# Patient Record
Sex: Male | Born: 1970 | Race: White | Hispanic: No | Marital: Married | Smoking: Never smoker
Health system: Southern US, Community
[De-identification: ages and names within clinical notes are randomized; demographics above are authoritative.]

## PROBLEM LIST (undated history)

## (undated) DIAGNOSIS — R6882 Decreased libido: Secondary | ICD-10-CM

## (undated) DIAGNOSIS — N50819 Testicular pain, unspecified: Secondary | ICD-10-CM

## (undated) DIAGNOSIS — R5383 Other fatigue: Secondary | ICD-10-CM

## (undated) DIAGNOSIS — K219 Gastro-esophageal reflux disease without esophagitis: Secondary | ICD-10-CM

## (undated) DIAGNOSIS — E291 Testicular hypofunction: Secondary | ICD-10-CM

## (undated) DIAGNOSIS — M1990 Unspecified osteoarthritis, unspecified site: Secondary | ICD-10-CM

## (undated) HISTORY — DX: Gastro-esophageal reflux disease without esophagitis: K21.9

## (undated) HISTORY — DX: Testicular pain, unspecified: N50.819

## (undated) HISTORY — DX: Other fatigue: R53.83

## (undated) HISTORY — PX: NO PAST SURGERIES: SHX2092

## (undated) HISTORY — DX: Testicular hypofunction: E29.1

## (undated) HISTORY — DX: Unspecified osteoarthritis, unspecified site: M19.90

## (undated) HISTORY — DX: Decreased libido: R68.82

---

## 1999-10-20 ENCOUNTER — Ambulatory Visit (HOSPITAL_COMMUNITY): Admission: RE | Admit: 1999-10-20 | Discharge: 1999-10-20 | Payer: Self-pay | Admitting: Neurosurgery

## 1999-10-20 ENCOUNTER — Encounter: Payer: Self-pay | Admitting: Neurosurgery

## 2001-11-24 ENCOUNTER — Encounter: Payer: Self-pay | Admitting: Gastroenterology

## 2001-11-24 ENCOUNTER — Encounter: Admission: RE | Admit: 2001-11-24 | Discharge: 2001-11-24 | Payer: Self-pay | Admitting: Gastroenterology

## 2002-10-12 ENCOUNTER — Encounter: Payer: Self-pay | Admitting: Internal Medicine

## 2003-05-26 ENCOUNTER — Ambulatory Visit (HOSPITAL_COMMUNITY): Admission: RE | Admit: 2003-05-26 | Discharge: 2003-05-26 | Payer: Self-pay | Admitting: Family Medicine

## 2004-12-09 IMAGING — US US SCROTUM
1 series · 14 of 25 positions shown · non-contrast
Comparison: none

CLINICAL DATA: Scrotal pain.

[Series 1: unknown · 0.09mm/px · 14 of 53 slices shown]
[im 1/53]
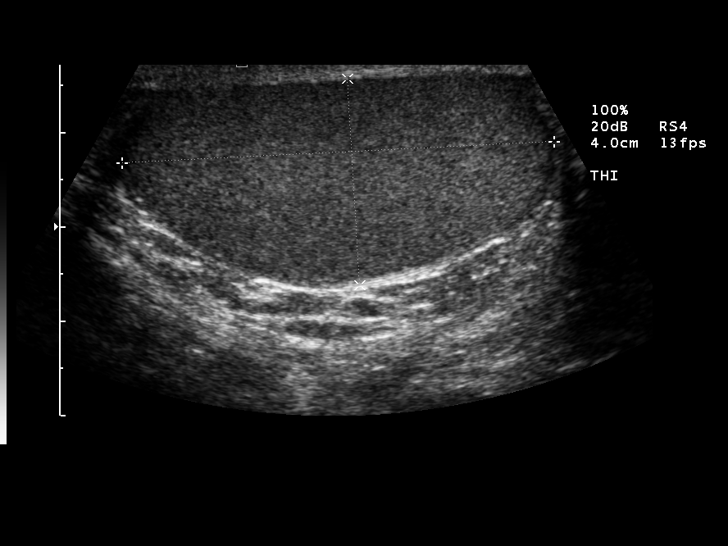
[im 5/53]
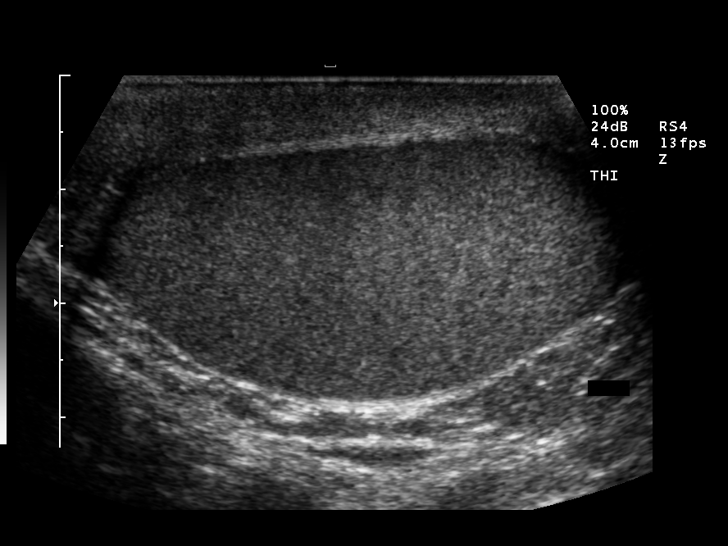
[im 9/53]
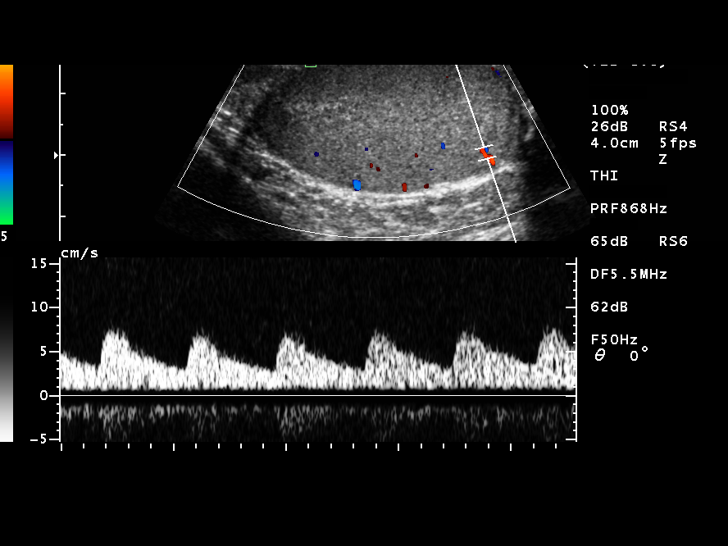
[im 14/53]
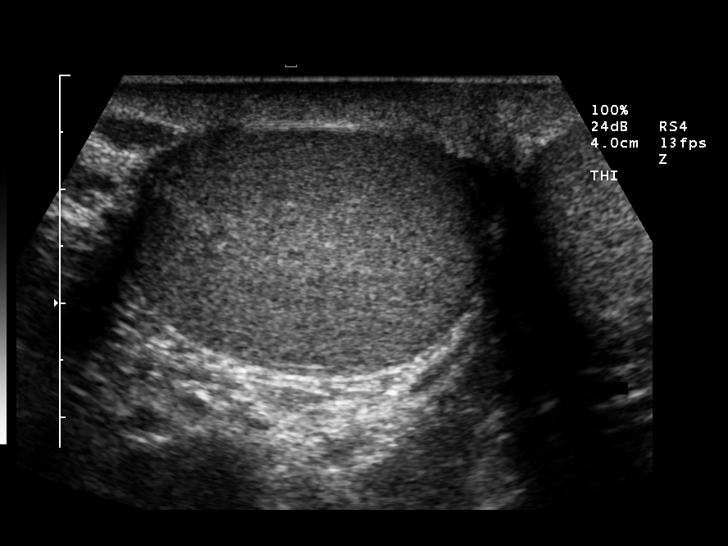
[im 18/53]
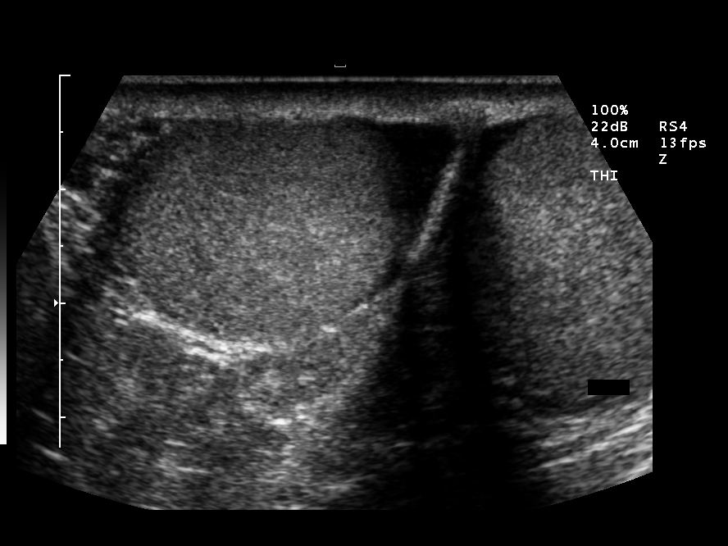
[im 20/53]
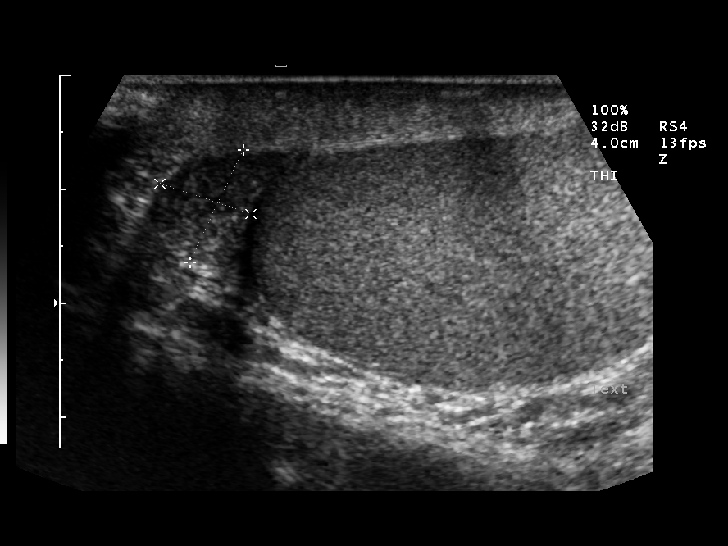
[im 24/53]
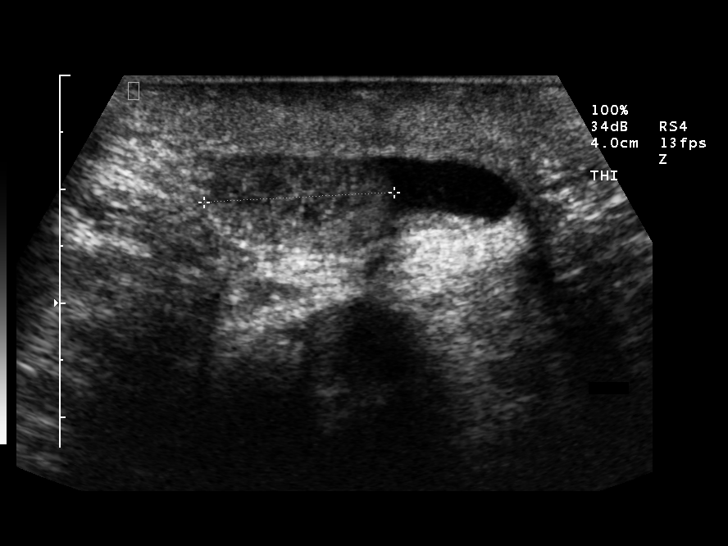
[im 29/53]
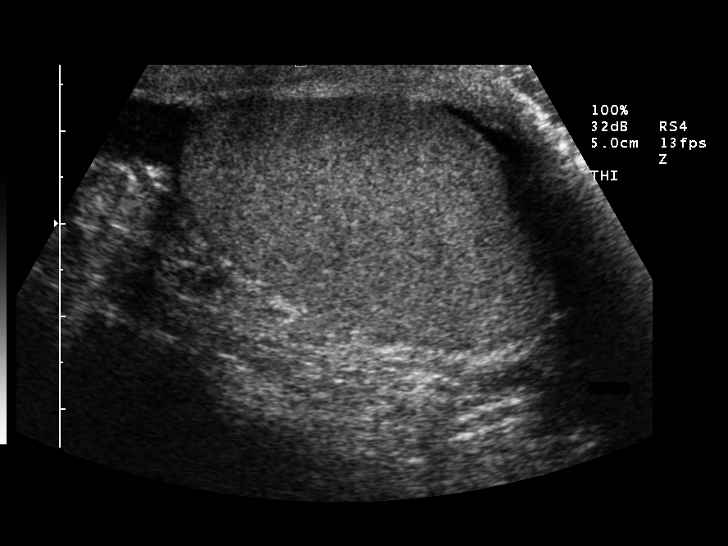
[im 33/53]
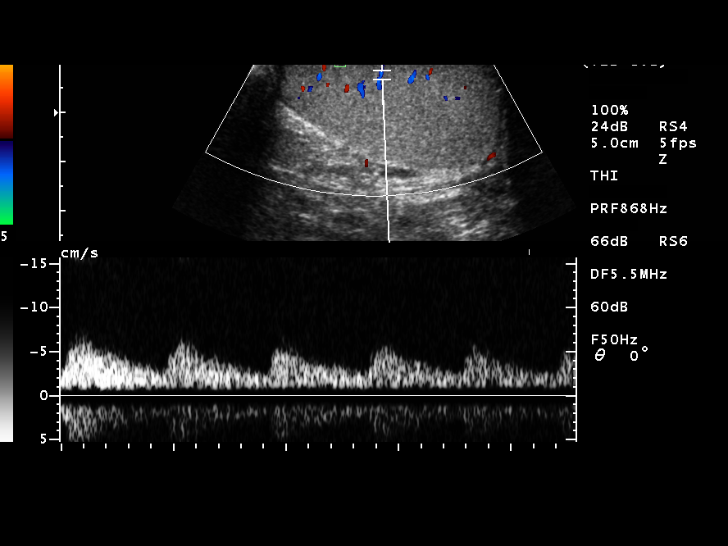
[im 35/53]
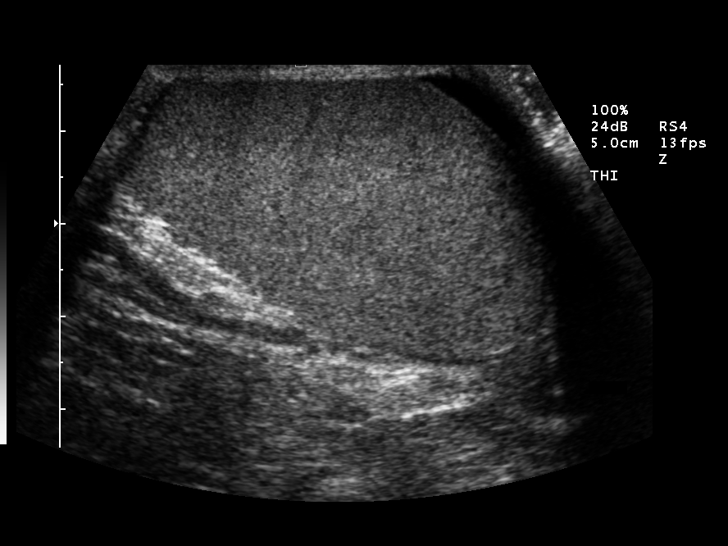
[im 40/53]
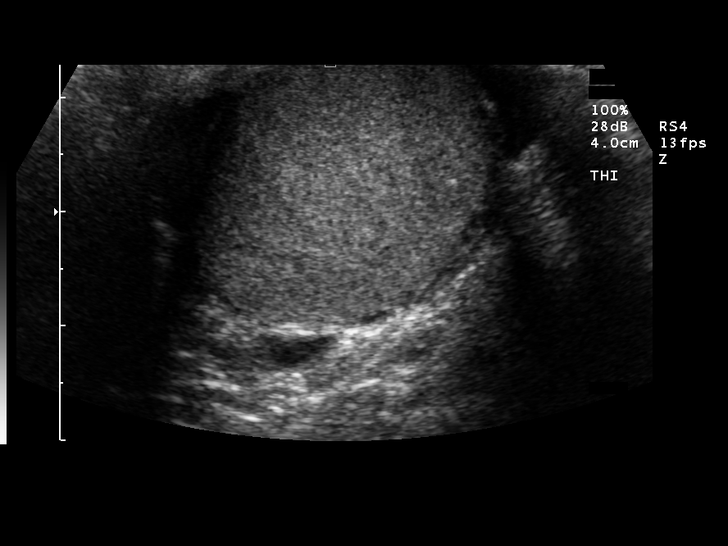
[im 44/53]
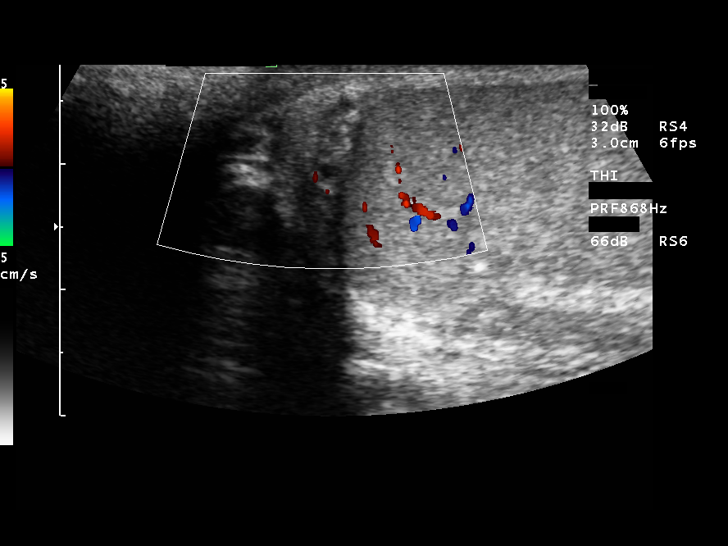
[im 48/53]
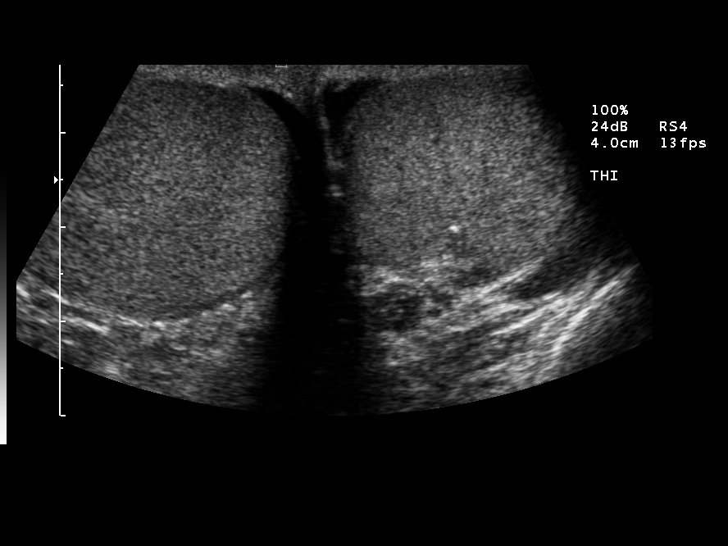
[im 53/53]
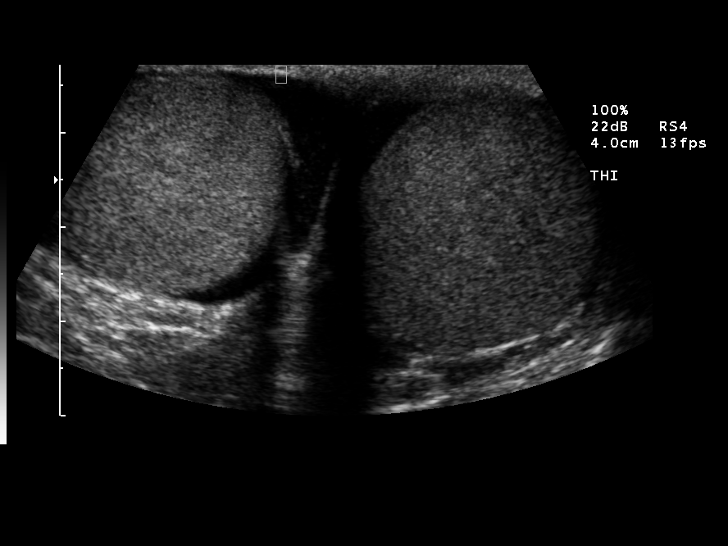

[14 of 25 positions shown; findings below may reference images not displayed]

ULTRASOUND OF THE SCROTUM
 The right testicle measures 4.6 x 2.2 x 3.1 cm.  The left testicle measures 4.9 x 2.7 x 3.0 cm.  The right and left epididymis is normal in size.  There is a small hydrocele bilaterally.  There is normal flow to both the right and left testicle.

 IMPRESSION
 No significant abnormality.  Small bilateral hydroceles.  The right and left epididymis is symmetric in size.

## 2007-07-24 ENCOUNTER — Emergency Department (HOSPITAL_COMMUNITY): Admission: EM | Admit: 2007-07-24 | Discharge: 2007-07-24 | Payer: Self-pay | Admitting: Emergency Medicine

## 2010-04-12 ENCOUNTER — Encounter: Payer: Self-pay | Admitting: Internal Medicine

## 2010-04-30 ENCOUNTER — Encounter: Payer: Self-pay | Admitting: Internal Medicine

## 2010-05-21 ENCOUNTER — Ambulatory Visit: Payer: Self-pay | Admitting: Internal Medicine

## 2010-05-21 DIAGNOSIS — R0609 Other forms of dyspnea: Secondary | ICD-10-CM

## 2010-05-21 DIAGNOSIS — J309 Allergic rhinitis, unspecified: Secondary | ICD-10-CM | POA: Insufficient documentation

## 2010-05-21 DIAGNOSIS — K219 Gastro-esophageal reflux disease without esophagitis: Secondary | ICD-10-CM

## 2010-05-21 DIAGNOSIS — R0989 Other specified symptoms and signs involving the circulatory and respiratory systems: Secondary | ICD-10-CM

## 2010-05-21 DIAGNOSIS — R0789 Other chest pain: Secondary | ICD-10-CM | POA: Insufficient documentation

## 2010-05-29 ENCOUNTER — Encounter: Payer: Self-pay | Admitting: Internal Medicine

## 2010-05-29 ENCOUNTER — Ambulatory Visit: Payer: Self-pay | Admitting: Internal Medicine

## 2010-06-03 ENCOUNTER — Telehealth: Payer: Self-pay | Admitting: Internal Medicine

## 2010-06-20 ENCOUNTER — Ambulatory Visit (HOSPITAL_COMMUNITY)
Admission: RE | Admit: 2010-06-20 | Discharge: 2010-06-20 | Payer: Self-pay | Source: Home / Self Care | Attending: Internal Medicine | Admitting: Internal Medicine

## 2010-06-20 ENCOUNTER — Encounter: Payer: Self-pay | Admitting: Internal Medicine

## 2010-07-14 ENCOUNTER — Encounter: Payer: Self-pay | Admitting: Family Medicine

## 2010-07-16 ENCOUNTER — Telehealth: Payer: Self-pay | Admitting: Internal Medicine

## 2010-07-25 NOTE — Letter (Signed)
[facility]   Imported By: Lennie Odor 06/05/2010 14:35:38  _____________________________________________________________________  External Attachment:    Type:   Image     Comment:   External Document

## 2010-07-25 NOTE — Miscellaneous (Signed)
Summary: Orders Update pft charges  Clinical Lists Changes  Orders: Added new Service order of Carbon Monoxide diffusing w/capacity (94720) - Signed Added new Service order of Lung Volumes (94240) - Signed Added new Service order of Spirometry (Pre & Post) (94060) - Signed 

## 2010-07-25 NOTE — Assessment & Plan Note (Signed)
Summary: CHEST TIGHTNESS/ MBW   Visit Type:  Initial Consult Copy to:  Dr. Loreta Ave Primary Provider/Referring Provider:  Dr. Charna Elizabeth  CC:  Pulmonary Consult for chest tightness. Pt has had 2 sinus infections in last 2 months. .  History of Present Illness: May 21, 2010: 40 year old male with passive smoking from birth to age 53 years.  c/o chest tightness. States that in younger years (age [age] years) was a speed Financial controller and he recollects that at peak exertion at that time would have chest tightness and post exertion would have cough. This has persisted all along even to this day and would notice exertional chest tightness for activities like running. He noticed this even after he joined [employer] at age 56. After that very physically active till few years ago and still would feel dyspneic at peak exertion.  In retrosepct, he thinks he might have had "SPORTS ASTHMA"  Past 2-4 years, sedantary without problems. However, some 3-4 months ago had a sinus infection and since then noticing chest tightness even at rest. Comes and goes. No specific precipitating or aggravating factors. Location is center of chest. Deep breathing changes tightness to dry cough but only sometimes. No associated radiation. Severity is mild to moderate. Has noticed mild associated dyspnea with chest tightnss but no wheeze. No sputum with cough but occassionally will have some mucus.  Then again 3 weeks ago had 2nd sinus infection which he thinks has made chest tightness worse.   Of note (in review of DR. Mann records), he states he has had GERD with heart burning "for a while". Then, in end OCt 2011 felt sudden food impaction. Had  UGI scope 04/15/2010 and biopsies showed eosinophlic esophagitis. So, started on flovent mdi but asked to swallow and PPI aciphex. With this, GERD symptoms are resolved. However, above chest tightness remains   Preventive Screening-Counseling & Management  Alcohol-Tobacco     Smoking  Status: never     Passive Smoke Exposure: yes  Comments: passive smoke - birth to age 14  Current Medications (verified): 1)  Aciphex 20 Mg Tbec (Rabeprazole Sodium) .... Take 1 Tablet By Mouth Once A Day 2)  Multivitamins  Tabs (Multiple Vitamin) .... Take 1 Tablet By Mouth Once A Day 3)  Flax Seed Oil 1000 Mg Caps (Flaxseed (Linseed)) .... Take 1 Tablet By Mouth Once A Day 4)  Flovent Hfa 110 Mcg/act Aero (Fluticasone Propionate  Hfa) .... 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 Smoking Status:  never Passive Smoke Exposure:  yes  Review of Systems       The patient complains of shortness of breath with activity, shortness of breath at rest, productive cough, non-productive cough, acid heartburn, indigestion, difficulty swallowing, sore throat, headaches, nasal congestion/difficulty breathing through nose, sneezing, and change in color of mucus.  The patient denies coughing up blood, chest pain, irregular heartbeats, loss of appetite, weight change, abdominal pain, tooth/dental problems, itching, ear ache, anxiety, depression, hand/feet swelling, joint stiffness or pain, rash, and fever.         chest tightness snores +  Vital Signs:  Patient profile:  40 year old male Height:      72 inches Weight:      208.38 pounds BMI:     28.36 O2 Sat:      97 % on Room air Temp:     97.8 degrees F oral Pulse rate:   88 / minute BP sitting:   146 / 82  (left arm) Cuff size:   regular  Vitals Entered By: Carron Curie CMA (May 21, 2010 9:06 AM)  O2 Flow:  Room air  Physical  Exam  General:  well developed, well nourished, in no acute distress Head:  normocephalic and atraumatic Eyes:  PERRLA/EOM intact; conjunctiva and sclera clear Ears:  TMs intact and clear with normal canals Nose:  no deformity, discharge, inflammation, or lesions Mouth:  no deformity or lesions Neck:  no masses, thyromegaly, or abnormal cervical nodes Chest Wall:  no deformities noted Lungs:  clear bilaterally to auscultation and percussion Heart:  regular rate and rhythm, S1, S2 without murmurs, rubs, gallops, or clicks Abdomen:  bowel sounds positive; abdomen soft and non-tender without masses, or organomegaly Msk:  no deformity or scoliosis noted with normal posture Pulses:  pulses normal Extremities:  no clubbing, cyanosis, edema, or deformity noted Neurologic:  CN II-XII grossly intact with normal reflexes, coordination, muscle strength and tone Skin:  intact without lesions or rashes Cervical Nodes:  no significant adenopathy Axillary Nodes:  no significant adenopathy Psych:  alert and cooperative; normal mood and affect; normal attention span and concentration   Impression & Recommendations:  Problem # 1:  CHEST DISCOMFORT (ICD-786.59) Assessment New Story of chest tightness with cough in the past when he  Exercises. And story that this is now occurring at rest following recent sinus infection,  in context of passive smoking, family history of asthma, persona hx of allergic rhinits are all very suggestive he has  asthma currently to account for symptoms  PLAN Flu shot today GEt full PFTs IF these are normal, will get methacholine challenge test Will get allergy records from [facility] For review  Orders: Pulmonary Referral (Pulmonary) Consultation Level V (30865)  Problem # 2:  SNORING (ICD-786.09) Assessment: New  do epworth sleep questionnaire next visit His updated medication list for this problem includes:    Flovent Hfa 110 Mcg/act Aero (Fluticasone  propionate  hfa) .Marland Kitchen... 2 puffs once a day  Orders: Consultation Level V (78469)  Medications Added to Medication List This Visit: 1)  Aciphex 20 Mg Tbec (Rabeprazole sodium) .... Take 1 tablet by mouth once a day 2)  Multivitamins Tabs (Multiple vitamin) .... Take 1 tablet by mouth once a day 3)  Flax Seed Oil 1000 Mg Caps (Flaxseed (linseed)) .... Take 1 tablet by mouth once a day 4)  Flovent Hfa 110 Mcg/act Aero (Fluticasone propionate  hfa) .... 2 puffs once a day  Patient Instructions: 1)  please have full PFTs 2)  have flu shot today 3)  after PFT test, let nurse know to inform me 4)  i will call with result and discuss next steep  Appended Document: CHEST TIGHTNESS/ MBW pls get allergy records from Dr. Stevphen [place] office  Appended Document: flu vaccine     Clinical Lists Changes  Orders: Added new Service order of Flu Vaccine 20yrs + [phone]) - Signed Added new Service order of Admin 1st Vaccine (84132) - Signed Observations: Added new observation of FLU VAXLOT: [id number] (05/21/2010 11:56) Added new observation of FLU VAX EXP: 12/21/2010 (05/21/2010 11:56) Added new observation  of FLU VAXBY: Carron Curie CMA (05/21/2010 11:56) Added new observation of FLU VAXRTE: IM (05/21/2010 11:56) Added new observation of FLU VAX DSE: 0.5 ml (05/21/2010 11:56) Added new observation of FLU VAXMFR: GlaxoSmithKline (05/21/2010 11:56) Added new observation of FLU VAX SITE: left deltoid (05/21/2010 11:56) Added new observation of FLU VAX: Fluvax 3+ (05/21/2010 11:56)       Immunizations Administered:  Influenza Vaccine # 1:    Vaccine Type: Fluvax 3+    Site: left deltoid    Mfr: GlaxoSmithKline    Dose: 0.5 ml    Route: IM    Given by: Carron Curie CMA    Exp. Date: 12/21/2010    Lot #: [id number]  Flu Vaccine Consent Questions:    Do you have a history of severe allergic reactions to this vaccine? no    Any prior history of allergic reactions to egg  and/or gelatin? no    Do you have a sensitivity to the preservative Thimersol? no    Do you have a past history of Guillan-Barre Syndrome? no    Do you currently have an acute febrile illness? no    Have you ever had a severe reaction to latex? no    Vaccine information given and explained to patient? yes  Appended Document: CHEST TIGHTNESS/ MBW records requested.

## 2010-07-25 NOTE — Progress Notes (Signed)
Summary: needs methacholinechallenge-[id number]  Phone Note Outgoing Call   Summary of Call: please tell him PFT 05/29/2010 is normal. So, needs methacholine challenge test. Should be off all inhalers  . ROV after methachokine challenge Initial call taken by: Kalman Shan MD,  June 03, 2010 5:28 PM  Follow-up for Phone Call        [email or web address]. Carron Curie CMA  June 04, 2010 9:06 AM   Additional Follow-up for Phone Call Additional follow up Details #1::        spoke to pt methacholine challenge scheduled for 06/06/10@11 :00am pt aware and will set up appt afterwards Additional Follow-up by: Oneita Jolly,  June 04, 2010 9:44 AM

## 2010-07-25 NOTE — Letter (Signed)
Summary: [facility]  [facility]   Imported By: Sherian Rein 05/24/2010 10:02:34  _____________________________________________________________________  External Attachment:    Type:   Image     Comment:   External Document

## 2010-07-25 NOTE — Progress Notes (Signed)
Summary: methachiline challenge 12/29 is normal  Phone Note Outgoing Call   Summary of Call: methacholine challenge 06/20/2010 is normal.  Pls give him appt to discuss Initial call taken by: Kalman Shan MD,  July 16, 2010 2:49 AM  Follow-up for Phone Call        pt advised of test results and th ened to set appt. he states he has to look over calender and call us back fo rappt. Carron Curie CMA  July 16, 2010 4:14 PM

## 2010-07-25 NOTE — Procedures (Signed)
Summary: EGD/[facility]  EGD/[facility]   Imported By: Sherian Rein 05/24/2010 10:00:17  _____________________________________________________________________  External Attachment:    Type:   Image     Comment:   External Document

## 2012-06-07 ENCOUNTER — Encounter: Payer: Self-pay | Admitting: *Deleted

## 2012-06-08 ENCOUNTER — Ambulatory Visit (INDEPENDENT_AMBULATORY_CARE_PROVIDER_SITE_OTHER): Payer: BC Managed Care – PPO | Admitting: Cardiovascular Disease

## 2012-06-08 VITALS — BP 142/89 | HR 89 | Ht 72.0 in | Wt 212.0 lb

## 2012-06-08 DIAGNOSIS — K219 Gastro-esophageal reflux disease without esophagitis: Secondary | ICD-10-CM

## 2012-06-08 DIAGNOSIS — E291 Testicular hypofunction: Secondary | ICD-10-CM

## 2012-06-08 DIAGNOSIS — R0789 Other chest pain: Secondary | ICD-10-CM

## 2012-06-08 DIAGNOSIS — R7989 Other specified abnormal findings of blood chemistry: Secondary | ICD-10-CM | POA: Insufficient documentation

## 2012-06-08 NOTE — Assessment & Plan Note (Signed)
Continue H2 blocker avoid high carb diet

## 2012-06-08 NOTE — Progress Notes (Signed)
Patient ID: Tony Warren, male   DOB: 1970-06-30, 41 y.o.   MRN: [id number] 41 yo referred by Dr Wilson Singer for atypical chest pain.  Going on last 3 weeks.  Atypical some pleuritic component.  Exercises a lot at [facility] and runs 6 miles/week with no problem  Sees Dr Annabell Howells for testosterone replacement.  Admits to previous use of steroids.  Pain improving last week.  Also has soreness in chest and back Relates this to bench pressing.  No other risk factors for CAD No palpitations, dyspnea or edema.  Denies other drug use or ETOH  ROS: Denies fever, malais, weight loss, blurry vision, decreased visual acuity, cough, sputum, SOB, hemoptysis, pleuritic pain, palpitaitons, heartburn, abdominal pain, melena, lower extremity edema, claudication, or rash.  All other systems reviewed and negative   General: Affect appropriate Healthy:  appears stated age HEENT: normal Neck supple with no adenopathy JVP normal no bruits no thyromegaly Lungs clear with no wheezing and good diaphragmatic motion Heart:  S1/S2 no murmur,rub, gallop or click PMI normal Abdomen: benighn, BS positve, no tenderness, no AAA no bruit.  No HSM or HJR Distal pulses intact with no bruits No edema Neuro non-focal Skin warm and dry No muscular weakness  Medications Current Outpatient Prescriptions  Medication Sig Dispense Refill  . Flaxseed, Linseed, (FLAXSEED OIL) 1000 MG CAPS Take 1 capsule by mouth daily.      . Glucosamine-Chondroitin (GLUCOSAMINE CHONDR COMPLEX PO) Take 1 capsule by mouth daily.      . Multiple Vitamins-Minerals (MULTIVITAMINS THER. W/MINERALS) TABS Take 1 tablet by mouth daily.      . Omega-3 Fatty Acids (FISH OIL) 1000 MG CAPS Take 1 capsule by mouth daily.      . RABEprazole (ACIPHEX) 20 MG tablet Take 20 mg by mouth daily.      Marland Kitchen testosterone cypionate (DEPOTESTOTERONE CYPIONATE) 200 MG/ML injection Inject into the muscle every 14 (fourteen) days.        Allergies Review of patient's allergies indicates no  known allergies.  Family History: Family History  Problem Relation Age of Onset  . Hypercholesterolemia      paternal   . Hypertension      maternal  . Non-Hodgkin's lymphoma      paternal    Social History: History   Social History  . Marital Status: Married    Spouse Name: N/A    Number of Children: N/A  . Years of Education: N/A   Occupational History  . Not on file.   Social History Main Topics  . Smoking status: Never Smoker   . Smokeless tobacco: Never Used  . Alcohol Use: No  . Drug Use: No  . Sexually Active: Not on file   Other Topics Concern  . Not on file   Social History Narrative  . No narrative on file    Electrocardiogram:  NSR rate 71 normal ECG no pericardial changes  Assessment and Plan

## 2012-06-08 NOTE — Assessment & Plan Note (Signed)
F/U Dr Annabell Howells Continue replacement.  Not clear that some of his myalgias and pain may not be from medium for IM injections

## 2012-06-08 NOTE — Assessment & Plan Note (Signed)
Atypical and improving Encouraged him to take advil or NSAI for 2 weeks or so to help.  No need for stress test at this time as exam and ECG normal no risk factors and atypical pain

## 2012-06-08 NOTE — Patient Instructions (Signed)
Your physician recommends that you schedule a follow-up appointment in: AS NEEDED  MAY CALL BACK AT A LATER DATE IF WOULD LIKE TO PROCEED WITH STRESS TEST IN FUTURE

## 2018-03-26 ENCOUNTER — Ambulatory Visit (INDEPENDENT_AMBULATORY_CARE_PROVIDER_SITE_OTHER): Payer: Self-pay | Admitting: Podiatry

## 2018-03-26 ENCOUNTER — Encounter: Payer: Self-pay | Admitting: Podiatry

## 2018-03-26 ENCOUNTER — Ambulatory Visit: Payer: Self-pay

## 2018-03-26 DIAGNOSIS — M722 Plantar fascial fibromatosis: Secondary | ICD-10-CM

## 2018-03-26 NOTE — Patient Instructions (Addendum)

## 2018-03-28 NOTE — Progress Notes (Signed)
   Subjective: 47 year old male presenting today as a new patient with a chief complaint of intermittent aching pain to the plantar aspect of the left heel that began about one year ago. He states the pain is worse in the morning when he first gets out of bed. Walking and being active on his feet increases the pain. He has tried stretching and icing the area for treatment. Patient is here for further evaluation and treatment.   Past Medical History:  Diagnosis Date  . Arthritis   . Decreased libido   . Esophageal reflux   . Fatigue   . Hypogonadism, male   . Testicular pain      Objective: Physical Exam General: The patient is alert and oriented x3 in no acute distress.  Dermatology: Skin is warm, dry and supple bilateral lower extremities. Negative for open lesions or macerations bilateral.   Vascular: Dorsalis Pedis and Posterior Tibial pulses palpable bilateral.  Capillary fill time is immediate to all digits.  Neurological: Epicritic and protective threshold intact bilateral.   Musculoskeletal: Tenderness to palpation to the plantar aspect of the left heel along the plantar fascia. All other joints range of motion within normal limits bilateral. Strength 5/5 in all groups bilateral.   Radiographic exam:   Normal osseous mineralization. Joint spaces preserved. No fracture/dislocation/boney destruction. No other soft tissue abnormalities or radiopaque foreign bodies.   Assessment: 1. Plantar fasciitis left foot  Plan of Care:  1. Patient evaluated. Xrays reviewed.   2. Injection of 0.5cc Celestone soluspan injected into the left plantar fascia.  3. Continue taking Meloxicam daily.  4. Discontinue using plantar fascial brace since it did not help.  5. Return to clinic as needed.    Felecia Shelling, DPM [facility]  Dr. Felecia Shelling, DPM    [address] [place], [place] [address]                Office [phone]    Fax [phone]

## 2020-05-14 ENCOUNTER — Other Ambulatory Visit: Payer: Self-pay | Admitting: Family

## 2020-05-14 ENCOUNTER — Telehealth (HOSPITAL_COMMUNITY): Payer: Self-pay

## 2020-05-14 DIAGNOSIS — U071 COVID-19: Secondary | ICD-10-CM

## 2020-05-14 NOTE — Telephone Encounter (Signed)
Called to discuss with patient about Covid symptoms and the use of the monoclonal antibody infusion for those with mild to moderate Covid symptoms and at a high risk of hospitalization.     Pt appears to qualify for this infusion due to co-morbid conditions and/or a member of an at-risk group in accordance with the FDA Emergency Use Authorization.    Risk factors include: Asthma   Symptom onset: 11/17. Cough, congestion, and fever   Tested positive for COVID 19: 11/22 at [facility].    Discussed information regarding costs of monoclonal antibody treatment, given both CPT & REV codes, and encouraged patient to call their health insurance company to verify cost of treatment that pt will be financially responsible for.  Patient aware they will receive a call from APP for further information and to schedule appointment. All questions answered.

## 2020-05-14 NOTE — Progress Notes (Signed)
I connected by phone with Tony Warren on 05/14/2020 at 6:21 PM to discuss the potential use of a new treatment for mild to moderate COVID-19 viral infection in non-hospitalized patients.  This patient is a 49 y.o. male that meets the FDA criteria for Emergency Use Authorization of COVID monoclonal antibody casirivimab/imdevimab, bamlanivimab/eteseviamb, or sotrovimab.  Has a (+) direct SARS-CoV-2 viral test result  Has mild or moderate COVID-19   Is NOT hospitalized due to COVID-19  Is within 10 days of symptom onset  Has at least one of the high risk factor(s) for progression to severe COVID-19 and/or hospitalization as defined in EUA.  Specific high risk criteria : Chronic Lung Disease   Symptom onset cough, sinus drainage began 05/09/20.   I have spoken and communicated the following to the patient or parent/caregiver regarding COVID monoclonal antibody treatment:  1. FDA has authorized the emergency use for the treatment of mild to moderate COVID-19 in adults and pediatric patients with positive results of direct SARS-CoV-2 viral testing who are 36 years of age and older weighing at least 40 kg, and who are at high risk for progressing to severe COVID-19 and/or hospitalization.  2. The significant known and potential risks and benefits of COVID monoclonal antibody, and the extent to which such potential risks and benefits are unknown.  3. Information on available alternative treatments and the risks and benefits of those alternatives, including clinical trials.  4. Patients treated with COVID monoclonal antibody should continue to self-isolate and use infection control measures (e.g., wear mask, isolate, social distance, avoid sharing personal items, clean and disinfect "high touch" surfaces, and frequent handwashing) according to CDC guidelines.   5. The patient or parent/caregiver has the option to accept or refuse COVID monoclonal antibody treatment.  After reviewing this  information with the patient, the patient has agreed to receive one of the available covid 19 monoclonal antibodies and will be provided an appropriate fact sheet prior to infusion. [name], NP 05/14/2020 6:21 PM

## 2020-05-15 ENCOUNTER — Ambulatory Visit (HOSPITAL_COMMUNITY)
Admission: RE | Admit: 2020-05-15 | Discharge: 2020-05-15 | Disposition: A | Discharge status: Home / Self Care | Payer: 59 | Source: Ambulatory Visit | Attending: Pulmonary Disease | Admitting: Pulmonary Disease

## 2020-05-15 ENCOUNTER — Other Ambulatory Visit (HOSPITAL_COMMUNITY): Payer: Self-pay

## 2020-05-15 DIAGNOSIS — J989 Respiratory disorder, unspecified: Secondary | ICD-10-CM | POA: Insufficient documentation

## 2020-05-15 DIAGNOSIS — U071 COVID-19: Secondary | ICD-10-CM | POA: Diagnosis present

## 2020-05-15 MED ORDER — ALBUTEROL SULFATE HFA 108 (90 BASE) MCG/ACT IN AERS: 2.0000 | INHALATION_SPRAY | Freq: Once | RESPIRATORY_TRACT | Status: DC | PRN

## 2020-05-15 MED ORDER — METHYLPREDNISOLONE SODIUM SUCC 125 MG IJ SOLR: 125.0000 mg | Freq: Once | INTRAMUSCULAR | Status: DC | PRN

## 2020-05-15 MED ORDER — FAMOTIDINE IN NACL 20-0.9 MG/50ML-% IV SOLN: 20.0000 mg | Freq: Once | INTRAVENOUS | Status: DC | PRN

## 2020-05-15 MED ORDER — SOTROVIMAB 500 MG/8ML IV SOLN
500.0000 mg | Freq: Once | INTRAVENOUS | Status: AC
  Administered 2020-05-15: 500 mg via INTRAVENOUS

## 2020-05-15 MED ORDER — EPINEPHRINE 0.3 MG/0.3ML IJ SOAJ: 0.3000 mg | Freq: Once | INTRAMUSCULAR | Status: DC | PRN

## 2020-05-15 MED ORDER — DIPHENHYDRAMINE HCL 50 MG/ML IJ SOLN: 50.0000 mg | Freq: Once | INTRAMUSCULAR | Status: DC | PRN

## 2020-05-15 MED ORDER — SODIUM CHLORIDE 0.9 % IV SOLN: INTRAVENOUS | Status: DC | PRN

## 2020-05-15 NOTE — Progress Notes (Signed)
Diagnosis: COVID-19  Physician: Dr. Patrick Wright  Procedure: Covid Infusion Clinic Med: Sotrovimab infusion - Provided patient with sotrovimab fact sheet for patients, parents, and caregivers prior to infusion.   Complications: No immediate complications noted  Discharge: Discharged home  If after the infusion you have any questions or concerns please call the Advanced Practice Provider at [phone] 

## 2020-05-15 NOTE — Discharge Instructions (Signed)

## 2020-05-15 NOTE — Progress Notes (Signed)
Patient reviewed Fact Sheet for Patients, Parents, and Caregivers for Emergency Use Authorization (EUA) of Sotrovimab for the Treatment of Coronavirus. Patient also reviewed and is agreeable to the estimated cost of treatment. Patient is agreeable to proceed.   

## 2022-05-08 DIAGNOSIS — Z13220 Encounter for screening for lipoid disorders: Secondary | ICD-10-CM | POA: Diagnosis not present

## 2022-05-08 DIAGNOSIS — Z0000 Encounter for general adult medical examination without abnormal findings: Secondary | ICD-10-CM | POA: Diagnosis not present

## 2022-05-08 DIAGNOSIS — Z125 Encounter for screening for malignant neoplasm of prostate: Secondary | ICD-10-CM | POA: Diagnosis not present

## 2022-05-20 DIAGNOSIS — Z83719 Family history of colon polyps, unspecified: Secondary | ICD-10-CM | POA: Diagnosis not present

## 2022-05-20 DIAGNOSIS — K200 Eosinophilic esophagitis: Secondary | ICD-10-CM | POA: Diagnosis not present

## 2022-05-20 DIAGNOSIS — Z1211 Encounter for screening for malignant neoplasm of colon: Secondary | ICD-10-CM | POA: Diagnosis not present

## 2022-05-20 DIAGNOSIS — K219 Gastro-esophageal reflux disease without esophagitis: Secondary | ICD-10-CM | POA: Diagnosis not present

## 2022-05-20 DIAGNOSIS — E669 Obesity, unspecified: Secondary | ICD-10-CM | POA: Diagnosis not present

## 2022-08-01 DIAGNOSIS — D128 Benign neoplasm of rectum: Secondary | ICD-10-CM | POA: Diagnosis not present

## 2022-08-01 DIAGNOSIS — D124 Benign neoplasm of descending colon: Secondary | ICD-10-CM | POA: Diagnosis not present

## 2022-08-01 DIAGNOSIS — Z1211 Encounter for screening for malignant neoplasm of colon: Secondary | ICD-10-CM | POA: Diagnosis not present

## 2022-08-01 DIAGNOSIS — K635 Polyp of colon: Secondary | ICD-10-CM | POA: Diagnosis not present

## 2022-08-01 DIAGNOSIS — D122 Benign neoplasm of ascending colon: Secondary | ICD-10-CM | POA: Diagnosis not present

## 2022-08-01 DIAGNOSIS — K621 Rectal polyp: Secondary | ICD-10-CM | POA: Diagnosis not present

## 2022-08-02 DIAGNOSIS — J40 Bronchitis, not specified as acute or chronic: Secondary | ICD-10-CM | POA: Diagnosis not present

## 2022-08-02 DIAGNOSIS — R0981 Nasal congestion: Secondary | ICD-10-CM | POA: Diagnosis not present

## 2022-08-02 DIAGNOSIS — H6993 Unspecified Eustachian tube disorder, bilateral: Secondary | ICD-10-CM | POA: Diagnosis not present

## 2022-08-02 DIAGNOSIS — R059 Cough, unspecified: Secondary | ICD-10-CM | POA: Diagnosis not present

## 2022-08-12 DIAGNOSIS — L439 Lichen planus, unspecified: Secondary | ICD-10-CM | POA: Diagnosis not present

## 2022-08-12 DIAGNOSIS — L814 Other melanin hyperpigmentation: Secondary | ICD-10-CM | POA: Diagnosis not present

## 2022-08-12 DIAGNOSIS — L570 Actinic keratosis: Secondary | ICD-10-CM | POA: Diagnosis not present

## 2022-09-03 DIAGNOSIS — R030 Elevated blood-pressure reading, without diagnosis of hypertension: Secondary | ICD-10-CM | POA: Diagnosis not present

## 2022-09-03 DIAGNOSIS — G4719 Other hypersomnia: Secondary | ICD-10-CM | POA: Diagnosis not present

## 2022-10-27 DIAGNOSIS — R030 Elevated blood-pressure reading, without diagnosis of hypertension: Secondary | ICD-10-CM | POA: Diagnosis not present

## 2022-10-27 DIAGNOSIS — G4733 Obstructive sleep apnea (adult) (pediatric): Secondary | ICD-10-CM | POA: Diagnosis not present

## 2022-11-01 DIAGNOSIS — G4733 Obstructive sleep apnea (adult) (pediatric): Secondary | ICD-10-CM | POA: Diagnosis not present

## 2022-12-29 DIAGNOSIS — E291 Testicular hypofunction: Secondary | ICD-10-CM | POA: Diagnosis not present

## 2022-12-29 DIAGNOSIS — N400 Enlarged prostate without lower urinary tract symptoms: Secondary | ICD-10-CM | POA: Diagnosis not present

## 2023-01-12 DIAGNOSIS — N5201 Erectile dysfunction due to arterial insufficiency: Secondary | ICD-10-CM | POA: Diagnosis not present

## 2023-01-12 DIAGNOSIS — E291 Testicular hypofunction: Secondary | ICD-10-CM | POA: Diagnosis not present

## 2023-02-14 ENCOUNTER — Other Ambulatory Visit (INDEPENDENT_AMBULATORY_CARE_PROVIDER_SITE_OTHER): Payer: Self-pay | Admitting: Podiatry

## 2023-02-14 DIAGNOSIS — S86011A Strain of right Achilles tendon, initial encounter: Secondary | ICD-10-CM

## 2023-02-14 NOTE — Progress Notes (Signed)
   No chief complaint on file.   HPI: 52 y.o. male seen at his home today for evaluation of an acute injury that occurred 02/12/2023.  Patient states that he was playing pickle ball and he chased after a ball.  As soon as he attempted tochase after the ball he immediately fell to the ground.  He heard an audible pop with a sharp shooting sensation to the back of the calf.  He states " it felt like someone took a baseball bat to the back of my calf.  I felt a shock throughout my entire body".  No other trauma.  Immediately he was unable to bear weight without pain.  He noticed swelling and bruising to the posterior aspect of the ankle over the last few days. Patient actually reached out to me and he was evaluated in his home.  Past Medical History:  Diagnosis Date   Arthritis    Decreased libido    Esophageal reflux    Fatigue    Hypogonadism, male    Testicular pain     Past Surgical History:  Procedure Laterality Date   NO PAST SURGERIES      No Known Allergies   Physical Exam: General: The patient is alert and oriented x3 in no acute distress.  Dermatology: Skin is warm, dry and supple bilateral lower extremities.  No open lacerations  Vascular: Palpable pedal pulses bilaterally. Capillary refill within normal limits.  Ecchymosis around the posterior aspect of the ankle settling into the heel.  Edema noted throughout the posterior aspect of the ankle as well  Neurological: Grossly intact via light touch  Musculoskeletal Exam: Loss of forced plantarflexion.  Palpable dell within the mid substance of the Achilles tendon with associated tenderness and pain with palpation.  Positive Thompson test with squeezing of the gastrocnemius muscle belly.  Assessment/Plan of Care: 1.  Acute Achilles tendon rupture right lower extremity  -Patient evaluated -Cam boot dispensed.  Minimal WBAT -RICE -Order placed for MRI RT ankle wo contrast -Patient will require surgery as soon as MRI  results are available for surgical planning.  Clinically the patient is positive for complete rupture of the Achilles tendon.  Surgery was discussed in detail with the patient including the postoperative recovery course and associated risks and benefits.  All patient questions were answered.  No guarantees were expressed or implied -Authorization and consent for surgery will be obtained after completion of the MRI. Will contact patient via telephone after MRI to review the results and proceed with surgical repair of the achilles.    Felecia Shelling, DPM [facility]  Dr. Felecia Shelling, DPM    [address] [place], [place] [address]                Office [phone]  Fax [phone]

## 2023-02-16 ENCOUNTER — Encounter: Payer: Self-pay | Admitting: Podiatry

## 2023-02-17 ENCOUNTER — Encounter: Payer: Self-pay | Admitting: Podiatry

## 2023-02-18 ENCOUNTER — Telehealth: Payer: Self-pay | Admitting: Podiatry

## 2023-02-18 NOTE — Telephone Encounter (Signed)
DOS 02/26/2023  ACHILLES TENDON REPAIR RT - [id number]  AETNA EFFECTIVE DATE - 06/23/2022  PER REX C WITH [employer]ET[employer] INSURANCE, CPT CODE 62376 DOE SNOT REQUIRE PRIOR AUTHORIZATION.   REFERENCE [id number]

## 2023-02-19 ENCOUNTER — Encounter: Payer: Self-pay | Admitting: Podiatry

## 2023-02-20 ENCOUNTER — Ambulatory Visit
Admission: RE | Admit: 2023-02-20 | Discharge: 2023-02-20 | Disposition: A | Discharge status: Home / Self Care | Payer: 59 | Source: Ambulatory Visit | Attending: Podiatry | Admitting: Podiatry

## 2023-02-20 DIAGNOSIS — S86011A Strain of right Achilles tendon, initial encounter: Secondary | ICD-10-CM | POA: Diagnosis not present

## 2023-02-20 DIAGNOSIS — M25571 Pain in right ankle and joints of right foot: Secondary | ICD-10-CM | POA: Diagnosis not present

## 2023-02-22 HISTORY — PX: ACHILLES TENDON REPAIR: SUR1153

## 2023-02-26 ENCOUNTER — Other Ambulatory Visit: Payer: Self-pay | Admitting: Podiatry

## 2023-02-26 DIAGNOSIS — G8918 Other acute postprocedural pain: Secondary | ICD-10-CM | POA: Diagnosis not present

## 2023-02-26 DIAGNOSIS — S86011A Strain of right Achilles tendon, initial encounter: Secondary | ICD-10-CM | POA: Diagnosis not present

## 2023-02-26 DIAGNOSIS — S86001D Unspecified injury of right Achilles tendon, subsequent encounter: Secondary | ICD-10-CM | POA: Diagnosis not present

## 2023-02-26 MED ORDER — IBUPROFEN 800 MG PO TABS: 800.0000 mg | ORAL_TABLET | Freq: Three times a day (TID) | ORAL | 1 refills | Status: DC

## 2023-02-26 MED ORDER — OXYCODONE-ACETAMINOPHEN 5-325 MG PO TABS: 1.0000 | ORAL_TABLET | ORAL | 0 refills | Status: DC | PRN

## 2023-02-26 MED ORDER — IBUPROFEN 800 MG PO TABS: 800.0000 mg | ORAL_TABLET | Freq: Three times a day (TID) | ORAL | 1 refills | Status: AC

## 2023-02-26 MED ORDER — OXYCODONE-ACETAMINOPHEN 5-325 MG PO TABS: 1.0000 | ORAL_TABLET | ORAL | 0 refills | Status: AC | PRN

## 2023-02-26 NOTE — Progress Notes (Signed)
PRN postop 

## 2023-03-02 ENCOUNTER — Other Ambulatory Visit: Payer: Self-pay | Admitting: Podiatry

## 2023-03-06 ENCOUNTER — Encounter: Payer: 59 | Admitting: Podiatry

## 2023-03-09 ENCOUNTER — Encounter: Payer: 59 | Admitting: Podiatry

## 2023-03-13 ENCOUNTER — Encounter: Payer: Self-pay | Admitting: Podiatry

## 2023-03-25 ENCOUNTER — Encounter: Payer: Self-pay | Admitting: Podiatry

## 2023-03-25 ENCOUNTER — Ambulatory Visit (INDEPENDENT_AMBULATORY_CARE_PROVIDER_SITE_OTHER): Payer: 59 | Admitting: Podiatry

## 2023-03-25 DIAGNOSIS — S86011A Strain of right Achilles tendon, initial encounter: Secondary | ICD-10-CM

## 2023-03-25 NOTE — Progress Notes (Signed)
   No chief complaint on file.   Subjective:  Patient presents today status post open primary repair of Achilles tendon rupture right lower extremity.  DOS: 02/26/2023.  Patient doing well.  He has been strictly nonweightbearing to the surgical extremity in the cast using crutches and a knee scooter.  No new complaints  Past Medical History:  Diagnosis Date   Arthritis    Decreased libido    Esophageal reflux    Fatigue    Hypogonadism, male    Testicular pain     Past Surgical History:  Procedure Laterality Date   ACHILLES TENDON REPAIR Right 02/2023   NO PAST SURGERIES      No Known Allergies  Objective/Physical Exam The fiberglass cast was bivalved and removed today.  Neurovascular status intact.  Incision well coapted with staples intact. No sign of infectious process noted. No dehiscence. No active bleeding noted.  Moderate edema noted to the surgical extremity.  Assessment: 1. s/p primary repair of Achilles tendon rupture RLE. DOS: 02/26/2023   Plan of Care:  -Patient was evaluated. - The below-knee fiberglass cast was bivalved and removed today. -Staples removed.  Silvadene cream and dressings applied. -Supplies provided to apply Silvadene cream along the incision site with nonstick gauze and Ace wrap daily -Patient may begin showering and washing and getting the foot wet -Patient allowed to remove the boot when nonambulatory.  Recommend passive range of motion exercises which were demonstrated today -Continue NWB in the cam boot for an additional 2-3 weeks.  After that the patient may begin to [facility] in the boot with the assistance of crutches -Plan to initiate physical therapy in approximately 4-6 weeks   Felecia Shelling, DPM [facility]  Dr. Felecia Shelling, DPM    [address] [place], [place] [address]                Office [phone]  Fax [phone]

## 2023-03-27 ENCOUNTER — Encounter: Payer: Self-pay | Admitting: Podiatry

## 2023-04-23 ENCOUNTER — Other Ambulatory Visit: Payer: Self-pay | Admitting: Podiatry

## 2023-04-23 DIAGNOSIS — S86011A Strain of right Achilles tendon, initial encounter: Secondary | ICD-10-CM

## 2023-05-12 ENCOUNTER — Ambulatory Visit: Payer: 59 | Attending: Podiatry | Admitting: Physical Therapy

## 2023-05-12 ENCOUNTER — Encounter: Payer: Self-pay | Admitting: Physical Therapy

## 2023-05-12 ENCOUNTER — Other Ambulatory Visit: Payer: Self-pay

## 2023-05-12 DIAGNOSIS — M25671 Stiffness of right ankle, not elsewhere classified: Secondary | ICD-10-CM | POA: Insufficient documentation

## 2023-05-12 DIAGNOSIS — S86011A Strain of right Achilles tendon, initial encounter: Secondary | ICD-10-CM | POA: Diagnosis not present

## 2023-05-12 DIAGNOSIS — M6281 Muscle weakness (generalized): Secondary | ICD-10-CM | POA: Insufficient documentation

## 2023-05-12 NOTE — Therapy (Signed)
OUTPATIENT PHYSICAL THERAPY LOWER EXTREMITY EVALUATION  Patient Name: Tony Warren MRN: [id number] DOB:07/27/1970, 52 y.o., male Today's Date: 05/12/2023   PT End of Session - 05/12/23 0948     Visit Number 1    Number of Visits --   1-2x/week   Date for PT Re-Evaluation 07/07/23    Authorization Type [employer] - FOTO    PT Start Time 0915    PT Stop Time 0947    PT Time Calculation (min) 32 min             Past Medical History:  Diagnosis Date   Arthritis    Decreased libido    Esophageal reflux    Fatigue    Hypogonadism, male    Testicular pain    Past Surgical History:  Procedure Laterality Date   ACHILLES TENDON REPAIR Right 02/2023   NO PAST SURGERIES     Patient Active Problem List   Diagnosis Date Noted   Low testosterone 06/08/2012   ALLERGIC RHINITIS 05/21/2010   G E R D 05/21/2010   SNORING 05/21/2010   CHEST DISCOMFORT 05/21/2010    PCP: Tony Chick., MD  REFERRING PROVIDER: Felecia Warren, DPM  THERAPY DIAG:  Stiffness of right ankle, not elsewhere classified - Plan: PT plan of care cert/re-cert  Muscle weakness - Plan: PT plan of care cert/re-cert  REFERRING DIAG: Achilles rupture, right, initial encounter [S86.011A]     Rationale for Evaluation and Treatment:  Rehabilitation  SUBJECTIVE:  PERTINENT PAST HISTORY:  none      PRECAUTIONS:   OP Date: 02/26/2023  2 weeks 03/12/2023  4 weeks 03/26/2023  6 weeks 04/09/2023  8 weeks 04/23/2023  10 weeks 05/07/2023  12 weeks 05/21/2023    From referral: "S/p primary repair achilles rupture 02/26/2023.  -AROM against light resistance band training -Transition out of CAM boot into tennis shoes. Gait training.  -Strengthening -Standard postop ortho protocol Any questions please feel free to reach out - Dr. Gala Warren, cell [phone]"  WEIGHT BEARING RESTRICTIONS Yes see precautions  FALLS:  Has patient fallen in last 6 months? No, Number of falls: 0  MOI/History of  condition:  Onset date: 02/26/2023  SUBJECTIVE STATEMENT  Tony Warren is a 52 y.o. male who presents to clinic with chief complaint of R ankle pain and stiffness following R achilles rupture and subsequent repair on 9/5.  He reports he is doing well overall.  Tony Warren been using the boot about 50% of the time.  He has been doing some light stretching.   Red flags:  denies   Pain:  Are you having pain? No  Occupation: Owns a business - Forensic psychologist: NA  Hand Dominance: NA  Patient Goals/Specific Activities: improve ROM   OBJECTIVE:   DIAGNOSTIC FINDINGS:  NA   GENERAL OBSERVATION/GAIT:   Walking without CAM boot with reduction in time in stance on R  SENSATION:  Light touch: Appears intact  PALPATION: Minimal TTP with some swelling about the R posterior ankle  LE MMT:  MMT Right (Eval) Left (Eval)  Hip flexion (L2, L3)    Knee extension (L3) 5 5  Knee flexion 5 5  Hip abduction    Hip extension    Hip external rotation    Hip internal rotation    Hip adduction    Ankle dorsiflexion (L4) n n  Ankle plantarflexion (S1)    Ankle inversion    Ankle eversion  Great Toe ext (L5)    Grossly     (Blank rows = not tested, score listed is out of 5 possible points.  N = WNL, D = diminished, C = clear for gross weakness with myotome testing, * = concordant pain with testing)  LE ROM:  ROM Right (Eval) Left (Eval)  Hip flexion    Hip extension    Hip abduction    Hip adduction    Hip internal rotation    Hip external rotation    Knee extension    Knee flexion    Ankle dorsiflexion 14 20  Ankle plantarflexion 35 35  Ankle inversion 20 20  Ankle eversion 30 30   (Blank rows = not tested, N = WNL, * = concordant pain with testing)  Functional Tests  Eval    Progressive balance screen (highest level completed for >/= 10''):  SLS: R 10'', L 10''                                                           SPECIAL  TESTS:     PATIENT SURVEYS:  FOTO not taken   TODAY'S TREATMENT: Therapeutic Exercise: Creating, reviewing, and completing below HEP   PATIENT EDUCATION:  POC, diagnosis, prognosis, HEP, and outcome measures.  Pt educated via explanation, demonstration, and handout (HEP).  Pt confirms understanding verbally.   HOME EXERCISE PROGRAM: Access Code: [email or web address] URL: [email or web address] Date: 05/12/2023 Prepared by: Tony Warren  Exercises - Seated Toe Towel Scrunches  - 1 x daily - 7 x weekly - 3 sets - 10 reps - Seated Ankle Plantarflexion with Resistance  - 1 x daily - 7 x weekly - 3 sets - 10 reps - Seated Figure 4 Ankle Inversion with Resistance  - 1 x daily - 7 x weekly - 3 sets - 10 reps - Seated Ankle Eversion with Resistance  - 1 x daily - 7 x weekly - 3 sets - 10 reps - Seated Ankle Dorsiflexion with Anchored Resistance  - 1 x daily - 7 x weekly - 3 sets - 10 reps  Treatment priorities   Eval                                                  ASSESSMENT:  CLINICAL IMPRESSION: Tony Warren is a 52 y.o. male who presents to clinic with signs and sxs consistent with R ankle weakness, swelling, and stiffness following R achilles repair on 9/5.  He is an avid Location manager and has been working on some stretching at home. Overall excellent ankle ROM with no pain.  We discussed tissue healing times and to gradually increase heel raise from 25% to 75% over the next month and to avoid any dynamic or excessive loading.  He can follow up in the next 30 days PRN.  OBJECTIVE IMPAIRMENTS: ankle strength, gait  ACTIVITY LIMITATIONS: full gym workouts, dynamic activities, jogging  PERSONAL FACTORS: See medical history and pertinent history   REHAB POTENTIAL: Good  CLINICAL DECISION MAKING: Stable/uncomplicated  EVALUATION COMPLEXITY: Low   GOALS:   SHORT TERM GOALS: Target date: 06/09/2023   Tony Warren will be >75% HEP compliant to improve carryover  between  sessions and facilitate independent management of condition  Evaluation: ongoing Goal status: INITIAL   LONG TERM GOALS: Target date: 07/07/2023   Tony Warren will report confidence in self management of condition at time of discharge with advanced HEP  Evaluation/Baseline: unable to self manage Goal status: INITIAL   2.  Tony Warren will be able to perform 5x S/L heel raise, not limited by pain  Evaluation/Baseline: limited Goal status: INITIAL   3.  Jonnathon will be able to return to moderate intensity LE gym workouts  Evaluation/Baseline: limited Goal status: INITIAL   PLAN: PT FREQUENCY: 1-2x/week  PT DURATION: 8 weeks  PLANNED INTERVENTIONS: Therapeutic exercises, Aquatic therapy, Therapeutic activity, Neuro Muscular re-education, Gait training, Patient/Family education, Joint mobilization, Dry Needling, Electrical stimulation, Spinal mobilization and/or manipulation, Moist heat, Taping, Vasopneumatic device, Ionotophoresis 4mg /ml Dexamethasone, and Manual therapy   Tony Warren PT, DPT 05/12/2023, 10:42 AM

## 2023-06-01 DIAGNOSIS — J019 Acute sinusitis, unspecified: Secondary | ICD-10-CM | POA: Diagnosis not present

## 2023-06-01 DIAGNOSIS — Z6831 Body mass index (BMI) 31.0-31.9, adult: Secondary | ICD-10-CM | POA: Diagnosis not present
